# Patient Record
Sex: Male | Born: 1985 | Race: Black or African American | Hispanic: No | Marital: Single | Smoking: Former smoker
Health system: Southern US, Community
[De-identification: ages and names within clinical notes are randomized; demographics above are authoritative.]

## PROBLEM LIST (undated history)

## (undated) HISTORY — PX: ABDOMINAL SURGERY: SHX537

## (undated) HISTORY — PX: FOOT SURGERY: SHX648

---
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
## 2003-05-08 ENCOUNTER — Encounter: Payer: Self-pay | Admitting: Emergency Medicine

## 2003-05-08 ENCOUNTER — Inpatient Hospital Stay (HOSPITAL_COMMUNITY): Admission: AC | Admit: 2003-05-08 | Discharge: 2003-05-11 | Payer: Self-pay

## 2006-01-02 ENCOUNTER — Emergency Department (HOSPITAL_COMMUNITY): Admission: EM | Admit: 2006-01-02 | Discharge: 2006-01-02 | Payer: Self-pay | Admitting: Family Medicine

## 2008-04-28 ENCOUNTER — Emergency Department (HOSPITAL_COMMUNITY): Admission: EM | Admit: 2008-04-28 | Discharge: 2008-04-28 | Payer: Self-pay | Admitting: Emergency Medicine

## 2010-12-28 ENCOUNTER — Emergency Department (HOSPITAL_COMMUNITY)
Admission: EM | Admit: 2010-12-28 | Discharge: 2010-12-28 | Disposition: A | Discharge status: Home / Self Care | Payer: Self-pay | Attending: Emergency Medicine | Admitting: Emergency Medicine

## 2010-12-28 DIAGNOSIS — K589 Irritable bowel syndrome without diarrhea: Secondary | ICD-10-CM | POA: Insufficient documentation

## 2010-12-28 LAB — URINALYSIS, ROUTINE W REFLEX MICROSCOPIC
Bilirubin Urine: NEGATIVE
Hgb urine dipstick: NEGATIVE
Nitrite: NEGATIVE
Specific Gravity, Urine: 1.027 (ref 1.005–1.030)
Urobilinogen, UA: 0.2 mg/dL (ref 0.0–1.0)
pH: 6 (ref 5.0–8.0)
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
## 2011-02-05 ENCOUNTER — Inpatient Hospital Stay (INDEPENDENT_AMBULATORY_CARE_PROVIDER_SITE_OTHER)
Admission: RE | Admit: 2011-02-05 | Discharge: 2011-02-05 | Disposition: A | Discharge status: Home / Self Care | Payer: Self-pay | Source: Ambulatory Visit | Attending: Emergency Medicine | Admitting: Emergency Medicine

## 2011-02-05 DIAGNOSIS — M549 Dorsalgia, unspecified: Secondary | ICD-10-CM

## 2011-02-05 LAB — POCT URINALYSIS DIP (DEVICE)
Bilirubin Urine: NEGATIVE
Glucose, UA: NEGATIVE mg/dL
Ketones, ur: NEGATIVE mg/dL
Leukocytes, UA: NEGATIVE
Nitrite: NEGATIVE
pH: 7 (ref 5.0–8.0)

## 2011-02-23 ENCOUNTER — Inpatient Hospital Stay (INDEPENDENT_AMBULATORY_CARE_PROVIDER_SITE_OTHER)
Admission: RE | Admit: 2011-02-23 | Discharge: 2011-02-23 | Disposition: A | Discharge status: Home / Self Care | Payer: Self-pay | Source: Ambulatory Visit | Attending: Family Medicine | Admitting: Family Medicine

## 2011-02-23 DIAGNOSIS — L0291 Cutaneous abscess, unspecified: Secondary | ICD-10-CM

## 2011-05-30 LAB — URINALYSIS, ROUTINE W REFLEX MICROSCOPIC
Leukocytes, UA: NEGATIVE
Nitrite: NEGATIVE
Specific Gravity, Urine: 1.015
Urobilinogen, UA: 0.2
pH: 7

## 2011-05-30 LAB — URINE MICROSCOPIC-ADD ON

## 2012-07-24 ENCOUNTER — Emergency Department (HOSPITAL_COMMUNITY): Payer: Self-pay

## 2012-07-24 ENCOUNTER — Encounter (HOSPITAL_COMMUNITY): Payer: Self-pay | Admitting: *Deleted

## 2012-07-24 ENCOUNTER — Emergency Department (HOSPITAL_COMMUNITY)
Admission: EM | Admit: 2012-07-24 | Discharge: 2012-07-24 | Disposition: A | Discharge status: Home / Self Care | Payer: Self-pay | Attending: Emergency Medicine | Admitting: Emergency Medicine

## 2012-07-24 DIAGNOSIS — F17200 Nicotine dependence, unspecified, uncomplicated: Secondary | ICD-10-CM | POA: Insufficient documentation

## 2012-07-24 DIAGNOSIS — M79643 Pain in unspecified hand: Secondary | ICD-10-CM

## 2012-07-24 DIAGNOSIS — M25549 Pain in joints of unspecified hand: Secondary | ICD-10-CM | POA: Insufficient documentation

## 2012-07-24 MED ORDER — NAPROXEN 500 MG PO TABS: 500.0000 mg | ORAL_TABLET | Freq: Two times a day (BID) | ORAL | Status: DC

## 2012-07-24 NOTE — ED Notes (Signed)
Denies injury, swelling noted to left hand. Worse with movement

## 2012-07-24 NOTE — ED Provider Notes (Signed)
History  This chart was scribed for Benny Lennert, MD by Erskine Emery, ED Scribe. This patient was seen in room TR05C/TR05C and the patient's care was started at 17:50.   CSN: [id number]  Arrival date & time 07/24/12  1731   None     Chief Complaint  Patient presents with  . Hand Pain    (Consider location/radiation/quality/duration/timing/severity/associated sxs/prior Treatment) George [name] is a 26 y.o. male who presents to the Emergency Department complaining of left hand pain and swelling for the last several days. Pt denies any injury to the area. Pt reports he works for [employer] but he has not been working for the last week because he injured his right shoulder. Patient is a 26 y.o. male presenting with hand pain. The history is provided by the patient. No language interpreter was used.  Hand Pain This is a new problem. The current episode started more than 2 days ago. The problem occurs constantly. The problem has not changed since onset.Pertinent negatives include no chest pain and no shortness of breath. Nothing aggravates the symptoms. Nothing relieves the symptoms. He has tried acetaminophen (ibuprofen) for the symptoms. The treatment provided no relief.    History reviewed. No pertinent past medical history.  History reviewed. No pertinent past surgical history.  History reviewed. No pertinent family history.  History  Substance Use Topics  . Smoking status: Current Every Day Smoker  . Smokeless tobacco: Not on file  . Alcohol Use: Yes      Review of Systems  Constitutional: Negative for fever and chills.  Respiratory: Negative for shortness of breath.   Cardiovascular: Negative for chest pain.  Gastrointestinal: Negative for nausea and vomiting.  Musculoskeletal:       Left hand pain and swelling  Neurological: Negative for weakness.    Allergies  Review of patient's allergies indicates no known allergies.  Home Medications  No current  outpatient prescriptions on file.  BP 134/80  Pulse 84  Temp 98.6 F (37 C) (Oral)  Resp 20  SpO2 99%  Physical Exam  Nursing note and vitals reviewed. Constitutional: He is oriented to person, place, and time. He appears well-developed.  HENT:  Head: Normocephalic.  Eyes: Conjunctivae normal are normal.  Neck: No tracheal deviation present.  Cardiovascular:  No murmur heard. Musculoskeletal: Normal range of motion.       Left hand is mildly swollen and tender over the 3rd, 4th, and 5th metacarpals.  Neurological: He is oriented to person, place, and time.  Skin: Skin is warm.  Psychiatric: He has a normal mood and affect.    ED Course  Procedures (including critical care time) DIAGNOSTIC STUDIES: Oxygen Saturation is 99% on room air, normal by my interpretation.    COORDINATION OF CARE: 17:50--I evaluated the patient and we discussed a treatment plan including x-ray to which the pt agreed.   18:37--I rechecked the pt and explained to him that his x-ray shows no signs of a fracture. The pt reports he is going to get his shoulder rechecked on Monday and I told him to have that physician check on his hand then too. Pt reports he is taking 500mg  OTC Tylenol x2/day.  Dg Hand Complete Left  07/24/2012  *RADIOLOGY REPORT*  Clinical Data: 26 year old male with left hand and second metacarpal pain.  LEFT HAND - COMPLETE 3+ VIEW  Comparison: None  Findings: No evidence of acute fracture, subluxation or dislocation identified.  No radio-opaque foreign bodies are present.  No focal  bony lesions are noted.  The joint spaces are unremarkable.  IMPRESSION: No evidence of acute bony abnormality.   Original Report Authenticated By: Harmon Pier, M.D.      No diagnosis found.    MDM        The chart was scribed for me under my direct supervision.  I personally performed the history, physical, and medical decision making and all procedures in the evaluation of this  patient.Benny Lennert, MD 07/24/12 [phone]

## 2012-07-24 NOTE — ED Notes (Signed)
Patient with left hand pain and swelling.  Denies any injury to his arm.

## 2013-05-25 ENCOUNTER — Encounter (HOSPITAL_COMMUNITY): Payer: Self-pay | Admitting: Emergency Medicine

## 2013-05-25 ENCOUNTER — Emergency Department (INDEPENDENT_AMBULATORY_CARE_PROVIDER_SITE_OTHER)
Admission: EM | Admit: 2013-05-25 | Discharge: 2013-05-25 | Disposition: A | Discharge status: Home / Self Care | Payer: BC Managed Care – PPO | Source: Home / Self Care | Attending: Family Medicine | Admitting: Family Medicine

## 2013-05-25 DIAGNOSIS — S161XXA Strain of muscle, fascia and tendon at neck level, initial encounter: Secondary | ICD-10-CM

## 2013-05-25 DIAGNOSIS — S139XXA Sprain of joints and ligaments of unspecified parts of neck, initial encounter: Secondary | ICD-10-CM

## 2013-05-25 MED ORDER — TRAMADOL HCL 50 MG PO TABS: 50.0000 mg | ORAL_TABLET | Freq: Three times a day (TID) | ORAL | Status: DC | PRN

## 2013-05-25 MED ORDER — CYCLOBENZAPRINE HCL 10 MG PO TABS: 10.0000 mg | ORAL_TABLET | Freq: Two times a day (BID) | ORAL | Status: DC | PRN

## 2013-05-25 MED ORDER — IBUPROFEN 600 MG PO TABS: 600.0000 mg | ORAL_TABLET | Freq: Three times a day (TID) | ORAL | Status: DC

## 2013-05-25 NOTE — ED Provider Notes (Signed)
CSN: [id number]     Arrival date & time 05/25/13  0815 History   First MD Initiated Contact with Patient 05/25/13 [phone]     Chief Complaint  Patient presents with  . Neck Pain    since wednesday. denies injury   (Consider location/radiation/quality/duration/timing/severity/associated sxs/prior Treatment) HPI Comments: 27 year old male comes complaining of 4 days with neck pain with radiation toward bilateral shoulder blades and mid bilateral back. Patient denies any known injury or recent falls. Denies weakness numbness or paresthesias in his upper extremities. He states he had similar episodes in the past. First time he fell the pain wasn't waking up 4 days ago. Pain gets better some times during the day. Pain score with anterior flexion and bilateral lateralization of the head. Denies sore throat, fever or chills. Or any other symptoms. Reports he does not exercise and does not perform, lifting or strenuous activities.   History reviewed. No pertinent past medical history. History reviewed. No pertinent past surgical history. History reviewed. No pertinent family history. History  Substance Use Topics  . Smoking status: Current Every Day Smoker  . Smokeless tobacco: Not on file  . Alcohol Use: Yes    Review of Systems  Constitutional: Negative for fever, chills and fatigue.  HENT: Positive for neck pain and neck stiffness. Negative for congestion, sore throat, trouble swallowing and sinus pressure.   Eyes: Negative for visual disturbance.  Respiratory: Negative for cough, shortness of breath and wheezing.   Cardiovascular: Negative for chest pain.  Gastrointestinal: Negative for nausea, vomiting and abdominal pain.  Musculoskeletal: Positive for back pain.  Skin: Negative for rash.  Neurological: Negative for dizziness and headaches.    Allergies  Review of patient's allergies indicates no known allergies.  Home Medications   Current Outpatient Rx  Name  Route  Sig  Dispense   Refill  . cyclobenzaprine (FLEXERIL) 10 MG tablet   Oral   Take 1 tablet (10 mg total) by mouth 2 (two) times daily as needed for muscle spasms.   20 tablet   0   . ibuprofen (ADVIL,MOTRIN) 600 MG tablet   Oral   Take 1 tablet (600 mg total) by mouth 3 (three) times daily. Take with food   30 tablet   0   . traMADol (ULTRAM) 50 MG tablet   Oral   Take 1 tablet (50 mg total) by mouth every 8 (eight) hours as needed for pain.   15 tablet   0    BP 118/75  Pulse 58  Temp(Src) 97.8 F (36.6 C) (Oral)  Resp 14  SpO2 100% Physical Exam  Nursing note and vitals reviewed. Constitutional: He is oriented to person, place, and time. He appears well-developed and well-nourished. No distress.  HENT:  Head: Normocephalic and atraumatic.  Right Ear: External ear normal.  Left Ear: External ear normal.  Mouth/Throat: Oropharynx is clear and moist. No oropharyngeal exudate.  Eyes: Conjunctivae and EOM are normal. Pupils are equal, round, and reactive to light. No scleral icterus.  Neck: No JVD present. No thyromegaly present.  Cervical spine central: No obvious deformity. No pain over bone processes.  Fair range of motion despite reported pain. Able but painful to touch chest with chin, head extend with minimal discomfort. Also pain worse with bilateral head rotation. Increased tone and tenderness to palpation over sternocleidomastoid muscles bilaterally and mid scapula area bilateral. Negative Spurling test.   Lymphadenopathy:    He has no cervical adenopathy.  Neurological: He is alert and oriented to  person, place, and time. He has normal strength and normal reflexes. No cranial nerve deficit or sensory deficit.  Skin: No rash noted. He is not diaphoretic.    ED Course  Procedures (including critical care time) Labs Review Labs Reviewed - No data to display Imaging Review No results found.  MDM   1. Neck strain, initial encounter   Treated with Flexeril, ibuprofen and  tramadol. Supportive care including rehabilitation exercises and red flags that should prompt his return to medical attention discussed with patient and provided in writing.  Sharin Grave, MD 05/25/13 [phone]

## 2013-05-25 NOTE — ED Notes (Signed)
C/o neck pain/stiffness. Pain is felt at back of neck through shoulder blades mid way down spine. Denies injury. Pt has used otc meds and heating pad with no relief.

## 2013-05-29 ENCOUNTER — Other Ambulatory Visit: Payer: Self-pay | Admitting: Internal Medicine

## 2013-05-29 ENCOUNTER — Ambulatory Visit
Admission: RE | Admit: 2013-05-29 | Discharge: 2013-05-29 | Disposition: A | Discharge status: Home / Self Care | Payer: BC Managed Care – PPO | Source: Ambulatory Visit | Attending: Internal Medicine | Admitting: Internal Medicine

## 2013-05-29 DIAGNOSIS — M542 Cervicalgia: Secondary | ICD-10-CM

## 2013-05-29 DIAGNOSIS — E049 Nontoxic goiter, unspecified: Secondary | ICD-10-CM

## 2013-07-04 ENCOUNTER — Emergency Department (HOSPITAL_COMMUNITY)
Admission: EM | Admit: 2013-07-04 | Discharge: 2013-07-04 | Disposition: A | Discharge status: Home / Self Care | Payer: BC Managed Care – PPO | Attending: Emergency Medicine | Admitting: Emergency Medicine

## 2013-07-04 ENCOUNTER — Encounter (HOSPITAL_COMMUNITY): Payer: Self-pay | Admitting: Emergency Medicine

## 2013-07-04 DIAGNOSIS — R509 Fever, unspecified: Secondary | ICD-10-CM | POA: Insufficient documentation

## 2013-07-04 DIAGNOSIS — R1110 Vomiting, unspecified: Secondary | ICD-10-CM | POA: Insufficient documentation

## 2013-07-04 DIAGNOSIS — F17200 Nicotine dependence, unspecified, uncomplicated: Secondary | ICD-10-CM | POA: Insufficient documentation

## 2013-07-04 DIAGNOSIS — R6889 Other general symptoms and signs: Secondary | ICD-10-CM

## 2013-07-04 MED ORDER — ACETAMINOPHEN 325 MG PO TABS: 650.0000 mg | ORAL_TABLET | Freq: Four times a day (QID) | ORAL | Status: DC | PRN

## 2013-07-04 MED ORDER — IBUPROFEN 800 MG PO TABS: 800.0000 mg | ORAL_TABLET | Freq: Four times a day (QID) | ORAL | Status: DC | PRN

## 2013-07-04 MED ORDER — ONDANSETRON 4 MG PO TBDP: 4.0000 mg | ORAL_TABLET | Freq: Three times a day (TID) | ORAL | Status: DC | PRN

## 2013-07-04 MED ORDER — ACETAMINOPHEN 325 MG PO TABS
650.0000 mg | ORAL_TABLET | Freq: Once | ORAL | Status: AC
  Administered 2013-07-04: 650 mg via ORAL
  Filled 2013-07-04: qty 2

## 2013-07-04 NOTE — ED Notes (Signed)
Took aleve for pain last evening ~ 1700.  And, it help.  However, it did not help ~ 0300 am.

## 2013-07-04 NOTE — ED Notes (Signed)
The pt is c/o a headache for 2 days with chills fever and vomiting

## 2013-07-04 NOTE — ED Provider Notes (Signed)
CSN: [id number]     Arrival date & time 07/04/13  0606 History   First MD Initiated Contact with Patient 07/04/13 [phone]     Chief Complaint  Patient presents with  . Headache   (Consider location/radiation/quality/duration/timing/severity/associated sxs/prior Treatment) HPI Comments: Patient is a 27 yo M PMHx significant for HTN presenting to the ED for two days of subjective fevers and chills w/ associated mild generalized headache that he rates 1/10. Patient states he had one episode of non-bloody non-bilious emesis this morning while brushing his teeth. Patient states he has tried Advil once with relief of his symptoms, but has not tried any further anti-pyretics or pain relievers since then. He denies any aggravating factors. Patient does endorse that his niece is sick at home with the same symptoms. He denies any neck pain or stiffness, visual disturbance, rash, syncope, cough, rhinorrhea, ear pain, sore throat, abdominal pain. He denies any tick bites.    Patient is a 27 y.o. male presenting with headaches.  Headache Associated symptoms: vomiting (1 episode)   Associated symptoms: no abdominal pain, no back pain, no congestion, no cough, no dizziness, no ear pain, no nausea, no neck pain, no neck stiffness, no numbness, no sinus pressure and no sore throat     History reviewed. No pertinent past medical history. History reviewed. No pertinent past surgical history. No family history on file. History  Substance Use Topics  . Smoking status: Current Every Day Smoker  . Smokeless tobacco: Not on file  . Alcohol Use: Yes    Review of Systems  Constitutional: Positive for chills.  HENT: Negative for congestion, dental problem, ear pain, rhinorrhea, sinus pressure, sneezing, sore throat and trouble swallowing.   Eyes: Negative for visual disturbance.  Respiratory: Negative for cough, chest tightness and shortness of breath.   Cardiovascular: Negative for chest pain.  Gastrointestinal:  Positive for vomiting (1 episode). Negative for nausea and abdominal pain.  Genitourinary: Negative.   Musculoskeletal: Negative for back pain, neck pain and neck stiffness.  Skin: Negative for rash.  Neurological: Positive for headaches. Negative for dizziness, syncope, weakness, light-headedness and numbness.  All other systems reviewed and are negative.    Allergies  Review of patient's allergies indicates no known allergies.  Home Medications   Current Outpatient Rx  Name  Route  Sig  Dispense  Refill  . acetaminophen (TYLENOL) 325 MG tablet   Oral   Take 2 tablets (650 mg total) by mouth every 6 (six) hours as needed for fever.   30 tablet   0   . ibuprofen (ADVIL,MOTRIN) 800 MG tablet   Oral   Take 1 tablet (800 mg total) by mouth every 6 (six) hours as needed for fever.   21 tablet   0   . ondansetron (ZOFRAN ODT) 4 MG disintegrating tablet   Oral   Take 1 tablet (4 mg total) by mouth every 8 (eight) hours as needed for nausea or vomiting.   10 tablet   0    BP 101/63  Pulse 77  Temp(Src) 98.5 F (36.9 C) (Oral)  Resp 18  SpO2 95% Physical Exam  Constitutional: He is oriented to person, place, and time. He appears well-developed and well-nourished. No distress.  HENT:  Head: Normocephalic and atraumatic.  Right Ear: External ear normal.  Left Ear: External ear normal.  Nose: Nose normal.  Mouth/Throat: Oropharynx is clear and moist.  Eyes: Conjunctivae and EOM are normal. Pupils are equal, round, and reactive to light.  Neck:  Normal range of motion and full passive range of motion without pain. Neck supple. No spinous process tenderness and no muscular tenderness present. No rigidity. No edema and no erythema present. No Brudzinski's sign and no Kernig's sign noted.  Cardiovascular: Normal rate, regular rhythm, normal heart sounds and intact distal pulses.   Pulmonary/Chest: Effort normal and breath sounds normal. No respiratory distress. He has no wheezes.  He has no rales. He exhibits no tenderness.  Abdominal: Soft. Bowel sounds are normal. He exhibits no distension. There is no tenderness. There is no rebound and no guarding.  Musculoskeletal: Normal range of motion. He exhibits no edema.  Lymphadenopathy:    He has no cervical adenopathy.  Neurological: He is alert and oriented to person, place, and time. He has normal strength. No cranial nerve deficit or sensory deficit. Gait normal. GCS eye subscore is 4. GCS verbal subscore is 5. GCS motor subscore is 6.  No pronator drift. Bilateral heel-knee-shin intact.   Skin: Skin is warm and dry. No rash noted. He is not diaphoretic. No erythema.    ED Course  Procedures (including critical care time)  Medications  acetaminophen (TYLENOL) tablet 650 mg (650 mg Oral Given 07/04/13 0717)    Labs Review Labs Reviewed - No data to display Imaging Review No results found.  EKG Interpretation   None      Filed Vitals:   07/04/13 0756  BP: 101/63  Pulse: 77  Temp:   Resp: 18    MDM   1. Flu-like symptoms    Pt NAD, non-toxic appearing, AAOx4. No neurofocal deficits on examination. Headache non concerning for [facility], ICH, Meningitis, or temporal arteritis. No nuchal rigidity, or change in vision. Patient with symptoms consistent with influenza.  Vitals are stable, low-grade fever.  No signs of dehydration, tolerating PO's.  Lungs are clear. Due to patient's presentation and physical exam a chest x-ray was not ordered bc likely diagnosis of flu.  Discussed the cost versus benefit of Tamiflu treatment with the patient.  The patient understands that symptoms are greater than the recommended 24-48 hour window of treatment.  Patient will be discharged with instructions to orally hydrate, rest, and use over-the-counter medications such as anti-inflammatories ibuprofen and Aleve for muscle aches and Tylenol for fever.  Return precautions discussed. Patient is agreeable to plan. Patient is stable at  time of discharge. Patient d/w with Dr. Hyacinth Meeker, agrees with plan.          Jeannetta Ellis, PA-C 07/04/13 0827

## 2013-07-04 NOTE — ED Notes (Signed)
H/a, could not sleep last night, chills, warm/cold, and sweats. No coughing,

## 2013-07-05 NOTE — ED Provider Notes (Signed)
Medical screening examination/treatment/procedure(s) were performed by non-physician practitioner and as supervising physician I was immediately available for consultation/collaboration.    Vida Roller, MD 07/05/13 [phone]

## 2014-08-25 ENCOUNTER — Encounter (HOSPITAL_COMMUNITY): Payer: Self-pay | Admitting: Emergency Medicine

## 2014-08-25 ENCOUNTER — Emergency Department (HOSPITAL_COMMUNITY)
Admission: EM | Admit: 2014-08-25 | Discharge: 2014-08-25 | Disposition: A | Discharge status: Home / Self Care | Payer: BC Managed Care – PPO | Attending: Emergency Medicine | Admitting: Emergency Medicine

## 2014-08-25 ENCOUNTER — Emergency Department (HOSPITAL_COMMUNITY): Payer: BC Managed Care – PPO

## 2014-08-25 DIAGNOSIS — Z87891 Personal history of nicotine dependence: Secondary | ICD-10-CM | POA: Insufficient documentation

## 2014-08-25 DIAGNOSIS — R079 Chest pain, unspecified: Secondary | ICD-10-CM | POA: Insufficient documentation

## 2014-08-25 LAB — CBC WITH DIFFERENTIAL/PLATELET
Basophils Absolute: 0 10*3/uL (ref 0.0–0.1)
Basophils Relative: 0 % (ref 0–1)
Eosinophils Absolute: 0.2 10*3/uL (ref 0.0–0.7)
Eosinophils Relative: 4 % (ref 0–5)
HCT: 44 % (ref 39.0–52.0)
HEMOGLOBIN: 15 g/dL (ref 13.0–17.0)
LYMPHS ABS: 1.9 10*3/uL (ref 0.7–4.0)
Lymphocytes Relative: 34 % (ref 12–46)
MCH: 30.7 pg (ref 26.0–34.0)
MCHC: 34.1 g/dL (ref 30.0–36.0)
MCV: 90.2 fL (ref 78.0–100.0)
MONOS PCT: 7 % (ref 3–12)
Monocytes Absolute: 0.4 10*3/uL (ref 0.1–1.0)
NEUTROS ABS: 3 10*3/uL (ref 1.7–7.7)
NEUTROS PCT: 55 % (ref 43–77)
Platelets: 251 10*3/uL (ref 150–400)
RBC: 4.88 MIL/uL (ref 4.22–5.81)
RDW: 12.3 % (ref 11.5–15.5)
WBC: 5.5 10*3/uL (ref 4.0–10.5)

## 2014-08-25 LAB — COMPREHENSIVE METABOLIC PANEL
ALBUMIN: 4.1 g/dL (ref 3.5–5.2)
ALK PHOS: 52 U/L (ref 39–117)
ALT: 12 U/L (ref 0–53)
ANION GAP: 4 — AB (ref 5–15)
AST: 21 U/L (ref 0–37)
BILIRUBIN TOTAL: 0.6 mg/dL (ref 0.3–1.2)
BUN: 9 mg/dL (ref 6–23)
CHLORIDE: 103 meq/L (ref 96–112)
CO2: 32 mmol/L (ref 19–32)
Calcium: 9.2 mg/dL (ref 8.4–10.5)
Creatinine, Ser: 0.95 mg/dL (ref 0.50–1.35)
GFR calc Af Amer: >90 mL/min (ref >90)
GFR calc non Af Amer: >90 mL/min (ref >90)
GLUCOSE: 87 mg/dL (ref 70–99)
POTASSIUM: 4 mmol/L (ref 3.5–5.1)
SODIUM: 139 mmol/L (ref 135–145)
Total Protein: 6.3 g/dL (ref 6.0–8.3)

## 2014-08-25 LAB — LIPASE, BLOOD: Lipase: 26 U/L (ref 11–59)

## 2014-08-25 MED ORDER — RANITIDINE HCL 150 MG PO CAPS: 150.0000 mg | ORAL_CAPSULE | Freq: Every day | ORAL | Status: DC

## 2014-08-25 NOTE — ED Provider Notes (Signed)
CSN: [id number]     Arrival date & time 08/25/14  [id number] History   First MD Initiated Contact with Patient 08/25/14 [phone]     Chief Complaint  Patient presents with  . Abdominal Pain     (Consider location/radiation/quality/duration/timing/severity/associated sxs/prior Treatment) HPI Comments: Pt c/o intermittent sharp chest pain for the last year.pain is always over the left lower ribs. States that he was taking reflux medication and it was working but it is not longer working. Pain started this morning but has resolved. Doesn't feel sob with the symptoms. No n/v, fever, cough.  The history is provided by the patient. No language interpreter was used.    History reviewed. No pertinent past medical history. Past Surgical History  Procedure Laterality Date  . Abdominal surgery    . Foot surgery     No family history on file. History  Substance Use Topics  . Smoking status: Former Games developermoker  . Smokeless tobacco: Not on file  . Alcohol Use: Yes    Review of Systems  All other systems reviewed and are negative.     Allergies  Review of patient's allergies indicates no known allergies.  Home Medications   Prior to Admission medications   Medication Sig Start Date End Date Taking? Authorizing Provider  acetaminophen (TYLENOL) 325 MG tablet Take 2 tablets (650 mg total) by mouth every 6 (six) hours as needed for fever. 07/04/13  Yes Jennifer L Piepenbrink, PA-C  ibuprofen (ADVIL,MOTRIN) 800 MG tablet Take 1 tablet (800 mg total) by mouth every 6 (six) hours as needed for fever. 07/04/13  Yes Jennifer L Piepenbrink, PA-C  ondansetron (ZOFRAN ODT) 4 MG disintegrating tablet Take 1 tablet (4 mg total) by mouth every 8 (eight) hours as needed for nausea or vomiting. 07/04/13  Yes Jennifer L Piepenbrink, PA-C   BP 115/73 mmHg  Pulse 73  Temp(Src) 98.8 F (37.1 C) (Oral)  Resp 17  SpO2 100% Physical Exam  Constitutional: He is oriented to person, place, and time. He appears  well-developed and well-nourished.  HENT:  Head: Normocephalic and atraumatic.  Cardiovascular: Normal rate and regular rhythm.   Pulmonary/Chest: Effort normal and breath sounds normal. He exhibits no tenderness.  Abdominal: Soft. Bowel sounds are normal.  Musculoskeletal: Normal range of motion.  Neurological: He is alert and oriented to person, place, and time. Coordination normal.  Skin: Skin is warm and dry.  Nursing note and vitals reviewed.   ED Course  Procedures (including critical care time) Labs Review Labs Reviewed  COMPREHENSIVE METABOLIC PANEL - Abnormal; Notable for the following:    Anion gap 4 (*)    All other components within normal limits  CBC WITH DIFFERENTIAL  LIPASE, BLOOD    Imaging Review Dg Chest 2 View  08/25/2014   CLINICAL DATA:  Chest pain.  EXAM: CHEST  2 VIEW  COMPARISON:  None.  FINDINGS: Cardiopericardial silhouette within normal limits. Mediastinal contours normal. Trachea midline. No airspace disease or effusion.  IMPRESSION: No active cardiopulmonary disease.   Electronically Signed   By: Andreas NewportGeoffrey  Lamke M.D.   On: 08/25/2014 07:38     EKG Interpretation   Date/Time:  Wednesday August 25 2014 07:08:50 EST Ventricular Rate:  68 PR Interval:  143 QRS Duration: 90 QT Interval:  376 QTC Calculation: 400 R Axis:   64 Text Interpretation:  Sinus rhythm No significant change since last  tracing Confirmed by Ethelda ChickJACUBOWITZ  MD, SAM [phone] on 08/25/2014 7:11:35 AM      MDM  Final diagnoses:  Chest pain   Doubt cardiac in nature likely gi related.Will send home with zantac. Discussed follow up and return precautions    Teressa LowerVrinda Yaniris Braddock, NP 08/25/14 1531  Doug SouSam Jacubowitz, MD 08/28/14 1557

## 2014-08-25 NOTE — ED Notes (Signed)
Pt arrives with c/o intermittent sharp abdominal pain, states its been going on for about a year but was worse this morning. Was taking gas medication for pain but wasn't working.

## 2014-08-25 NOTE — Discharge Instructions (Signed)

## 2014-09-03 ENCOUNTER — Emergency Department (HOSPITAL_COMMUNITY)
Admission: EM | Admit: 2014-09-03 | Discharge: 2014-09-03 | Disposition: A | Discharge status: Home / Self Care | Payer: BC Managed Care – PPO | Attending: Emergency Medicine | Admitting: Emergency Medicine

## 2014-09-03 ENCOUNTER — Emergency Department (HOSPITAL_COMMUNITY): Admission: EM | Admit: 2014-09-03 | Discharge: 2014-09-03 | Payer: Self-pay | Source: Home / Self Care

## 2014-09-03 ENCOUNTER — Emergency Department (HOSPITAL_COMMUNITY): Payer: BC Managed Care – PPO

## 2014-09-03 ENCOUNTER — Encounter (HOSPITAL_COMMUNITY): Payer: Self-pay | Admitting: *Deleted

## 2014-09-03 DIAGNOSIS — J069 Acute upper respiratory infection, unspecified: Secondary | ICD-10-CM | POA: Insufficient documentation

## 2014-09-03 DIAGNOSIS — R0602 Shortness of breath: Secondary | ICD-10-CM

## 2014-09-03 DIAGNOSIS — Z79899 Other long term (current) drug therapy: Secondary | ICD-10-CM | POA: Insufficient documentation

## 2014-09-03 DIAGNOSIS — Z87891 Personal history of nicotine dependence: Secondary | ICD-10-CM | POA: Insufficient documentation

## 2014-09-03 NOTE — ED Provider Notes (Signed)
CSN: [id number]     Arrival date & time 09/03/14  0847 History   First MD Initiated Contact with Patient 09/03/14 [phone]     Chief Complaint  Patient presents with  . URI     (Consider location/radiation/quality/duration/timing/severity/associated sxs/prior Treatment) Patient is a 29 y.o. male presenting with URI. The history is provided by the patient and medical records.  URI Presenting symptoms: congestion and cough    This is a 29 y.o. M with no significant PMH presenting to the ED for URI type symptoms.  Specifically patient has had productive cough with yellow/green sputum, sneezing, and nasal congestion.  He has had numerous sick contacts at his work.  Endorses subjective fever and chills, seems worse at night time.  He denies chest pain or SOB.  Has started taking OTC claritin D which seems to be helping somewhat.  History reviewed. No pertinent past medical history. Past Surgical History  Procedure Laterality Date  . Abdominal surgery    . Foot surgery     No family history on file. History  Substance Use Topics  . Smoking status: Former Games developer  . Smokeless tobacco: Not on file  . Alcohol Use: Yes    Review of Systems  HENT: Positive for congestion.   Respiratory: Positive for cough.   All other systems reviewed and are negative.     Allergies  Review of patient's allergies indicates no known allergies.  Home Medications   Prior to Admission medications   Medication Sig Start Date End Date Taking? Authorizing Provider  acetaminophen (TYLENOL) 325 MG tablet Take 2 tablets (650 mg total) by mouth every 6 (six) hours as needed for fever. 07/04/13   Jennifer L Piepenbrink, PA-C  ibuprofen (ADVIL,MOTRIN) 800 MG tablet Take 1 tablet (800 mg total) by mouth every 6 (six) hours as needed for fever. 07/04/13   Jennifer L Piepenbrink, PA-C  ondansetron (ZOFRAN ODT) 4 MG disintegrating tablet Take 1 tablet (4 mg total) by mouth every 8 (eight) hours as needed for nausea or  vomiting. 07/04/13   Jennifer L Piepenbrink, PA-C  ranitidine (ZANTAC) 150 MG capsule Take 1 capsule (150 mg total) by mouth daily. 08/25/14   Teressa Lower, NP   BP 116/74 mmHg  Pulse 74  Temp(Src) 98.3 F (36.8 C) (Oral)  Resp 24  Ht  (1.753 m)  Wt 140 lb (63.504 kg)  BMI 20.67 kg/m2  SpO2 99%   Physical Exam  Constitutional: He is oriented to person, place, and time. He appears well-developed and well-nourished.  HENT:  Head: Normocephalic and atraumatic.  Right Ear: Tympanic membrane and ear canal normal.  Left Ear: Tympanic membrane and ear canal normal.  Nose: Mucosal edema and rhinorrhea (clear) present.  Mouth/Throat: Uvula is midline, oropharynx is clear and moist and mucous membranes are normal. No oropharyngeal exudate, posterior oropharyngeal edema, posterior oropharyngeal erythema or tonsillar abscesses.  PND noted; Tonsils normal in appearance bilaterally without exudate; uvula midline without peritonsillar abscess; handling secretions appropriately; no difficulty swallowing or speaking  Eyes: Conjunctivae and EOM are normal. Pupils are equal, round, and reactive to light.  Neck: Normal range of motion.  Cardiovascular: Normal rate, regular rhythm and normal heart sounds.   Pulmonary/Chest: Effort normal and breath sounds normal. No respiratory distress. He has no wheezes.  Lungs clear bilaterally, productive cough with white sputum noted  Abdominal: Soft. Bowel sounds are normal.  Musculoskeletal: Normal range of motion.  Neurological: He is alert and oriented to person, place, and time.  Skin:  Skin is warm and dry.  Psychiatric: He has a normal mood and affect.  Nursing note and vitals reviewed.   ED Course  Procedures (including critical care time) Labs Review Labs Reviewed - No data to display  Imaging Review No results found.   EKG Interpretation None      MDM   Final diagnoses:  URI (upper respiratory infection)   29 y.o. M with URI type  symptoms.  CXR negative.  No chest pain or SOB-- doubt ACS, PE, dissection, or other acute cardiac event at this time.  Patient d/c home with supportive care.  FU with PCP. Discussed plan with patient, he/she acknowledged understanding and agreed with plan of care.  Return precautions given for new or worsening symptoms.  Garlon HatchetLisa M Taffany Heiser, PA-C 09/03/14 1226  Doug SouSam Jacubowitz, MD 09/03/14 [phone]

## 2014-09-03 NOTE — ED Notes (Signed)
Patient reports he has had cold sx since Sunday with coughing and sneezing.  He denies fevers.  Patient is having hot spells at night.  He also states she get light headed at times when he has coughing spells,  He states he has productive cough at times with green yellow sputum.  Patient has tried claritin D.  Patient resides at home.

## 2014-09-03 NOTE — Discharge Instructions (Signed)
Continue over the counter medications as needed. Follow-up with your primary care physician. Return to the ED for new concerns.

## 2014-10-09 ENCOUNTER — Emergency Department (HOSPITAL_COMMUNITY)
Admission: EM | Admit: 2014-10-09 | Discharge: 2014-10-09 | Disposition: A | Discharge status: Home / Self Care | Payer: BC Managed Care – PPO | Attending: Emergency Medicine | Admitting: Emergency Medicine

## 2014-10-09 ENCOUNTER — Encounter (HOSPITAL_COMMUNITY): Payer: Self-pay | Admitting: Emergency Medicine

## 2014-10-09 DIAGNOSIS — Z87891 Personal history of nicotine dependence: Secondary | ICD-10-CM | POA: Insufficient documentation

## 2014-10-09 DIAGNOSIS — Z9889 Other specified postprocedural states: Secondary | ICD-10-CM | POA: Insufficient documentation

## 2014-10-09 DIAGNOSIS — K2920 Alcoholic gastritis without bleeding: Secondary | ICD-10-CM

## 2014-10-09 DIAGNOSIS — Z79899 Other long term (current) drug therapy: Secondary | ICD-10-CM | POA: Insufficient documentation

## 2014-10-09 LAB — RAPID URINE DRUG SCREEN, HOSP PERFORMED
AMPHETAMINES: NOT DETECTED
BENZODIAZEPINES: NOT DETECTED
Barbiturates: NOT DETECTED
Cocaine: NOT DETECTED
OPIATES: NOT DETECTED
Tetrahydrocannabinol: NOT DETECTED

## 2014-10-09 LAB — CBC WITH DIFFERENTIAL/PLATELET
BASOS PCT: 0 % (ref 0–1)
Basophils Absolute: 0 10*3/uL (ref 0.0–0.1)
Eosinophils Absolute: 0.1 10*3/uL (ref 0.0–0.7)
Eosinophils Relative: 2 % (ref 0–5)
HCT: 46.2 % (ref 39.0–52.0)
Hemoglobin: 16 g/dL (ref 13.0–17.0)
LYMPHS ABS: 1.2 10*3/uL (ref 0.7–4.0)
Lymphocytes Relative: 18 % (ref 12–46)
MCH: 31.4 pg (ref 26.0–34.0)
MCHC: 34.6 g/dL (ref 30.0–36.0)
MCV: 90.6 fL (ref 78.0–100.0)
Monocytes Absolute: 0.3 10*3/uL (ref 0.1–1.0)
Monocytes Relative: 5 % (ref 3–12)
NEUTROS ABS: 5 10*3/uL (ref 1.7–7.7)
NEUTROS PCT: 75 % (ref 43–77)
Platelets: 250 10*3/uL (ref 150–400)
RBC: 5.1 MIL/uL (ref 4.22–5.81)
RDW: 12.6 % (ref 11.5–15.5)
WBC: 6.7 10*3/uL (ref 4.0–10.5)

## 2014-10-09 LAB — COMPREHENSIVE METABOLIC PANEL
ALK PHOS: 51 U/L (ref 39–117)
ALT: 19 U/L (ref 0–53)
AST: 28 U/L (ref 0–37)
Albumin: 4.4 g/dL (ref 3.5–5.2)
Anion gap: 7 (ref 5–15)
BUN: 7 mg/dL (ref 6–23)
CHLORIDE: 106 mmol/L (ref 96–112)
CO2: 28 mmol/L (ref 19–32)
Calcium: 9.2 mg/dL (ref 8.4–10.5)
Creatinine, Ser: 0.83 mg/dL (ref 0.50–1.35)
GFR calc non Af Amer: >90 mL/min (ref >90)
Glucose, Bld: 98 mg/dL (ref 70–99)
POTASSIUM: 3.7 mmol/L (ref 3.5–5.1)
SODIUM: 141 mmol/L (ref 135–145)
TOTAL PROTEIN: 7.1 g/dL (ref 6.0–8.3)
Total Bilirubin: 1.7 mg/dL — ABNORMAL HIGH (ref 0.3–1.2)

## 2014-10-09 LAB — LIPASE, BLOOD: LIPASE: 19 U/L (ref 11–59)

## 2014-10-09 LAB — ETHANOL: ALCOHOL ETHYL (B): 33 mg/dL — AB (ref 0–9)

## 2014-10-09 MED ORDER — SUCRALFATE 1 G PO TABS: 1.0000 g | ORAL_TABLET | Freq: Four times a day (QID) | ORAL | Status: DC

## 2014-10-09 MED ORDER — SODIUM CHLORIDE 0.9 % IV SOLN
INTRAVENOUS | Status: DC
  Administered 2014-10-09: 21:00:00 via INTRAVENOUS

## 2014-10-09 MED ORDER — ONDANSETRON HCL 4 MG/2ML IJ SOLN
4.0000 mg | Freq: Once | INTRAMUSCULAR | Status: AC
  Administered 2014-10-09: 4 mg via INTRAVENOUS
  Filled 2014-10-09: qty 2

## 2014-10-09 MED ORDER — PANTOPRAZOLE SODIUM 40 MG IV SOLR
40.0000 mg | Freq: Once | INTRAVENOUS | Status: AC
  Administered 2014-10-09: 40 mg via INTRAVENOUS
  Filled 2014-10-09: qty 40

## 2014-10-09 MED ORDER — ONDANSETRON 8 MG PO TBDP: 8.0000 mg | ORAL_TABLET | Freq: Three times a day (TID) | ORAL | Status: DC | PRN

## 2014-10-09 MED ORDER — SODIUM CHLORIDE 0.9 % IV BOLUS (SEPSIS)
2000.0000 mL | Freq: Once | INTRAVENOUS | Status: AC
Start: 2014-10-09 — End: 2014-10-09
  Administered 2014-10-09: 2000 mL via INTRAVENOUS

## 2014-10-09 NOTE — Discharge Instructions (Signed)
Gastritis, Adult Gastritis is soreness and swelling (inflammation) of the lining of the stomach. Gastritis can develop as a sudden onset (acute) or long-term (chronic) condition. If gastritis is not treated, it can lead to stomach bleeding and ulcers. CAUSES  Gastritis occurs when the stomach lining is weak or damaged. Digestive juices from the stomach then inflame the weakened stomach lining. The stomach lining may be weak or damaged due to viral or bacterial infections. One common bacterial infection is the Helicobacter pylori infection. Gastritis can also result from excessive alcohol consumption, taking certain medicines, or having too much acid in the stomach.  SYMPTOMS  In some cases, there are no symptoms. When symptoms are present, they may include:  Pain or a burning sensation in the upper abdomen.  Nausea.  Vomiting.  An uncomfortable feeling of fullness after eating. DIAGNOSIS  Your caregiver may suspect you have gastritis based on your symptoms and a physical exam. To determine the cause of your gastritis, your caregiver may perform the following:  Blood or stool tests to check for the H pylori bacterium.  Gastroscopy. A thin, flexible tube (endoscope) is passed down the esophagus and into the stomach. The endoscope has a light and camera on the end. Your caregiver uses the endoscope to view the inside of the stomach.  Taking a tissue sample (biopsy) from the stomach to examine under a microscope. TREATMENT  Depending on the cause of your gastritis, medicines may be prescribed. If you have a bacterial infection, such as an H pylori infection, antibiotics may be given. If your gastritis is caused by too much acid in the stomach, H2 blockers or antacids may be given. Your caregiver may recommend that you stop taking aspirin, ibuprofen, or other nonsteroidal anti-inflammatory drugs (NSAIDs). HOME CARE INSTRUCTIONS  Only take over-the-counter or prescription medicines as directed by  your caregiver.  If you were given antibiotic medicines, take them as directed. Finish them even if you start to feel better.  Drink enough fluids to keep your urine clear or pale yellow.  Avoid foods and drinks that make your symptoms worse, such as:  Caffeine or alcoholic drinks.  Chocolate.  Peppermint or mint flavorings.  Garlic and onions.  Spicy foods.  Citrus fruits, such as oranges, lemons, or limes.  Tomato-based foods such as sauce, chili, salsa, and pizza.  Fried and fatty foods.  Eat small, frequent meals instead of large meals. SEEK IMMEDIATE MEDICAL CARE IF:   You have black or dark red stools.  You vomit blood or material that looks like coffee grounds.  You are unable to keep fluids down.  Your abdominal pain gets worse.  You have a fever.  You do not feel better after 1 week.  You have any other questions or concerns. MAKE SURE YOU:  Understand these instructions.  Will watch your condition.  Will get help right away if you are not doing well or get worse. Document Released: 08/07/2001 Document Revised: 02/12/2012 Document Reviewed: 09/26/2011 [facility] Patient Information 2015 [place], [place]. This information is not intended to replace advice given to you by your health care provider. Make sure you discuss any questions you have with your health care provider.  Gastritis, Adult Gastritis is soreness and swelling (inflammation) of the lining of the stomach. Gastritis can develop as a sudden onset (acute) or long-term (chronic) condition. If gastritis is not treated, it can lead to stomach bleeding and ulcers. CAUSES  Gastritis occurs when the stomach lining is weak or damaged. Digestive juices from  the stomach then inflame the weakened stomach lining. The stomach lining may be weak or damaged due to viral or bacterial infections. One common bacterial infection is the Helicobacter pylori infection. Gastritis can also result from excessive alcohol  consumption, taking certain medicines, or having too much acid in the stomach.  SYMPTOMS  In some cases, there are no symptoms. When symptoms are present, they may include:  Pain or a burning sensation in the upper abdomen.  Nausea.  Vomiting.  An uncomfortable feeling of fullness after eating. DIAGNOSIS  Your caregiver may suspect you have gastritis based on your symptoms and a physical exam. To determine the cause of your gastritis, your caregiver may perform the following:  Blood or stool tests to check for the H pylori bacterium.  Gastroscopy. A thin, flexible tube (endoscope) is passed down the esophagus and into the stomach. The endoscope has a light and camera on the end. Your caregiver uses the endoscope to view the inside of the stomach.  Taking a tissue sample (biopsy) from the stomach to examine under a microscope. TREATMENT  Depending on the cause of your gastritis, medicines may be prescribed. If you have a bacterial infection, such as an H pylori infection, antibiotics may be given. If your gastritis is caused by too much acid in the stomach, H2 blockers or antacids may be given. Your caregiver may recommend that you stop taking aspirin, ibuprofen, or other nonsteroidal anti-inflammatory drugs (NSAIDs). HOME CARE INSTRUCTIONS  Only take over-the-counter or prescription medicines as directed by your caregiver.  If you were given antibiotic medicines, take them as directed. Finish them even if you start to feel better.  Drink enough fluids to keep your urine clear or pale yellow.  Avoid foods and drinks that make your symptoms worse, such as:  Caffeine or alcoholic drinks.  Chocolate.  Peppermint or mint flavorings.  Garlic and onions.  Spicy foods.  Citrus fruits, such as oranges, lemons, or limes.  Tomato-based foods such as sauce, chili, salsa, and pizza.  Fried and fatty foods.  Eat small, frequent meals instead of large meals. SEEK IMMEDIATE MEDICAL  CARE IF:   You have black or dark red stools.  You vomit blood or material that looks like coffee grounds.  You are unable to keep fluids down.  Your abdominal pain gets worse.  You have a fever.  You do not feel better after 1 week.  You have any other questions or concerns. MAKE SURE YOU:  Understand these instructions.  Will watch your condition.  Will get help right away if you are not doing well or get worse. Document Released: 08/07/2001 Document Revised: 02/12/2012 Document Reviewed: 09/26/2011 [facility] Patient Information 2015 [place]. This information is not intended to replace advice given to you by your health care provider. Make sure you discuss any questions you have with your health care provider.

## 2014-10-09 NOTE — ED Notes (Signed)
Pt reports that he thinks he has alcohol poisoning from drinking liquor last night.  Pt reports last drink was at 3am and does not recall how much he drank.  Pt is experiencing nausea vomiting and lightheadedness.  Pt alert and oriented.

## 2014-10-09 NOTE — ED Notes (Signed)
Pt reports ,"I think I have alcohol poisoning." Pt unable to tolerate PO fluids or food. Last drink was 0300...ciroc.

## 2014-10-09 NOTE — ED Provider Notes (Signed)
CSN: [id number]     Arrival date & time 10/09/14  1836 History   First MD Initiated Contact with Patient 10/09/14 2009     Chief Complaint  Patient presents with  . Alcohol Problem  . Emesis     (Consider location/radiation/quality/duration/timing/severity/associated sxs/prior Treatment) HPI Comments: Patient here after having emesis all day due to excessive alcohol use yesterday evening. Denies any blood in his emesis. He has been nonbilious. No black or bloody stools. No fever or chills. Denies any epigastric abdominal pain. Can't keep any fluids down. Symptoms persistent and nothing makes them better. No treatment used prior to arrival.  Patient is a 29 y.o. male presenting with alcohol problem and vomiting. The history is provided by the patient.  Alcohol Problem  Emesis   History reviewed. No pertinent past medical history. Past Surgical History  Procedure Laterality Date  . Abdominal surgery    . Foot surgery     No family history on file. History  Substance Use Topics  . Smoking status: Former Games developer  . Smokeless tobacco: Not on file  . Alcohol Use: Yes    Review of Systems  Gastrointestinal: Positive for vomiting.  All other systems reviewed and are negative.     Allergies  Review of patient's allergies indicates no known allergies.  Home Medications   Prior to Admission medications   Medication Sig Start Date End Date Taking? Authorizing Provider  acetaminophen (TYLENOL) 325 MG tablet Take 2 tablets (650 mg total) by mouth every 6 (six) hours as needed for fever. 07/04/13   Jennifer L Piepenbrink, PA-C  ibuprofen (ADVIL,MOTRIN) 800 MG tablet Take 1 tablet (800 mg total) by mouth every 6 (six) hours as needed for fever. 07/04/13   Jennifer L Piepenbrink, PA-C  ondansetron (ZOFRAN ODT) 4 MG disintegrating tablet Take 1 tablet (4 mg total) by mouth every 8 (eight) hours as needed for nausea or vomiting. 07/04/13   Jennifer L Piepenbrink, PA-C  ranitidine (ZANTAC)  150 MG capsule Take 1 capsule (150 mg total) by mouth daily. 08/25/14   Teressa Lower, NP   BP 109/74 mmHg  Pulse 74  Temp(Src) 97.8 F (36.6 C) (Oral)  Resp 14  Ht  (1.753 m)  Wt 140 lb (63.504 kg)  BMI 20.67 kg/m2  SpO2 99% Physical Exam  Constitutional: He is oriented to person, place, and time. He appears well-developed and well-nourished.  Non-toxic appearance. No distress.  HENT:  Head: Normocephalic and atraumatic.  Eyes: Conjunctivae, EOM and lids are normal. Pupils are equal, round, and reactive to light.  Neck: Normal range of motion. Neck supple. No tracheal deviation present. No thyroid mass present.  Cardiovascular: Normal rate, regular rhythm and normal heart sounds.  Exam reveals no gallop.   No murmur heard. Pulmonary/Chest: Effort normal and breath sounds normal. No stridor. No respiratory distress. He has no decreased breath sounds. He has no wheezes. He has no rhonchi. He has no rales.  Abdominal: Soft. Normal appearance and bowel sounds are normal. He exhibits no distension. There is no tenderness. There is no rebound and no CVA tenderness.  Musculoskeletal: Normal range of motion. He exhibits no edema or tenderness.  Neurological: He is alert and oriented to person, place, and time. He has normal strength. No cranial nerve deficit or sensory deficit. GCS eye subscore is 4. GCS verbal subscore is 5. GCS motor subscore is 6.  Skin: Skin is warm and dry. No abrasion and no rash noted.  Psychiatric: He has a normal mood  and affect. His speech is normal and behavior is normal.  Nursing note and vitals reviewed.   ED Course  Procedures (including critical care time) Labs Review Labs Reviewed  COMPREHENSIVE METABOLIC PANEL - Abnormal; Notable for the following:    Total Bilirubin 1.7 (*)    All other components within normal limits  ETHANOL - Abnormal; Notable for the following:    Alcohol, Ethyl (B) 33 (*)    All other components within normal limits  CBC  WITH DIFFERENTIAL/PLATELET  LIPASE, BLOOD  URINE RAPID DRUG SCREEN (HOSP PERFORMED)    Imaging Review No results found.   EKG Interpretation None      MDM   Final diagnoses:  None    Patient given IV fluids and medications for suspected alcoholic gastritis. Rechecked and is feeling better at this time and is stable for discharge    Toy BakerAnthony T Din Bookwalter, MD 10/09/14 2223

## 2014-10-09 NOTE — ED Notes (Signed)
Pt made aware to return if symptoms worsen or if any life threatening symptoms occur.   

## 2014-12-30 ENCOUNTER — Emergency Department (HOSPITAL_COMMUNITY): Payer: BC Managed Care – PPO

## 2014-12-30 ENCOUNTER — Emergency Department (HOSPITAL_COMMUNITY)
Admission: EM | Admit: 2014-12-30 | Discharge: 2014-12-30 | Disposition: A | Discharge status: Home / Self Care | Payer: BC Managed Care – PPO | Attending: Emergency Medicine | Admitting: Emergency Medicine

## 2014-12-30 ENCOUNTER — Encounter (HOSPITAL_COMMUNITY): Payer: Self-pay | Admitting: Emergency Medicine

## 2014-12-30 DIAGNOSIS — Z87891 Personal history of nicotine dependence: Secondary | ICD-10-CM | POA: Insufficient documentation

## 2014-12-30 DIAGNOSIS — R109 Unspecified abdominal pain: Secondary | ICD-10-CM

## 2014-12-30 DIAGNOSIS — R1084 Generalized abdominal pain: Secondary | ICD-10-CM | POA: Insufficient documentation

## 2014-12-30 DIAGNOSIS — Z87442 Personal history of urinary calculi: Secondary | ICD-10-CM | POA: Insufficient documentation

## 2014-12-30 DIAGNOSIS — Z9889 Other specified postprocedural states: Secondary | ICD-10-CM | POA: Insufficient documentation

## 2014-12-30 DIAGNOSIS — Z79899 Other long term (current) drug therapy: Secondary | ICD-10-CM | POA: Insufficient documentation

## 2014-12-30 DIAGNOSIS — M549 Dorsalgia, unspecified: Secondary | ICD-10-CM | POA: Insufficient documentation

## 2014-12-30 LAB — COMPREHENSIVE METABOLIC PANEL
ALT: 19 U/L (ref 17–63)
AST: 24 U/L (ref 15–41)
Albumin: 4.1 g/dL (ref 3.5–5.0)
Alkaline Phosphatase: 46 U/L (ref 38–126)
Anion gap: 9 (ref 5–15)
BUN: 8 mg/dL (ref 6–20)
CHLORIDE: 105 mmol/L (ref 101–111)
CO2: 28 mmol/L (ref 22–32)
CREATININE: 0.98 mg/dL (ref 0.61–1.24)
Calcium: 9.1 mg/dL (ref 8.9–10.3)
GFR calc non Af Amer: >60 mL/min (ref >60)
GLUCOSE: 81 mg/dL (ref 70–99)
Potassium: 3.6 mmol/L (ref 3.5–5.1)
Sodium: 142 mmol/L (ref 135–145)
Total Bilirubin: 0.7 mg/dL (ref 0.3–1.2)
Total Protein: 6.4 g/dL — ABNORMAL LOW (ref 6.5–8.1)

## 2014-12-30 LAB — CBC WITH DIFFERENTIAL/PLATELET
Basophils Absolute: 0 10*3/uL (ref 0.0–0.1)
Basophils Relative: 0 % (ref 0–1)
Eosinophils Absolute: 0.3 10*3/uL (ref 0.0–0.7)
Eosinophils Relative: 4 % (ref 0–5)
HEMATOCRIT: 43.2 % (ref 39.0–52.0)
Hemoglobin: 14.8 g/dL (ref 13.0–17.0)
LYMPHS PCT: 31 % (ref 12–46)
Lymphs Abs: 2.3 10*3/uL (ref 0.7–4.0)
MCH: 31.4 pg (ref 26.0–34.0)
MCHC: 34.3 g/dL (ref 30.0–36.0)
MCV: 91.5 fL (ref 78.0–100.0)
MONO ABS: 0.5 10*3/uL (ref 0.1–1.0)
Monocytes Relative: 6 % (ref 3–12)
Neutro Abs: 4.2 10*3/uL (ref 1.7–7.7)
Neutrophils Relative %: 59 % (ref 43–77)
Platelets: 247 10*3/uL (ref 150–400)
RBC: 4.72 MIL/uL (ref 4.22–5.81)
RDW: 12.2 % (ref 11.5–15.5)
WBC: 7.2 10*3/uL (ref 4.0–10.5)

## 2014-12-30 MED ORDER — HYDROMORPHONE HCL 1 MG/ML IJ SOLN
1.0000 mg | Freq: Once | INTRAMUSCULAR | Status: AC
  Administered 2014-12-30: 1 mg via INTRAVENOUS
  Filled 2014-12-30: qty 1

## 2014-12-30 MED ORDER — IBUPROFEN 600 MG PO TABS: 600.0000 mg | ORAL_TABLET | Freq: Three times a day (TID) | ORAL | Status: AC | PRN

## 2014-12-30 MED ORDER — HYDROCODONE-ACETAMINOPHEN 5-325 MG PO TABS: 1.0000 | ORAL_TABLET | Freq: Four times a day (QID) | ORAL | Status: AC | PRN

## 2014-12-30 NOTE — ED Provider Notes (Signed)
CSN: [id number]     Arrival date & time 12/30/14  [id number] History   First MD Initiated Contact with Patient 12/30/14 [phone]     Chief Complaint  Patient presents with  . Abdominal Pain  . Back Pain      HPI Patient reports right flank pain since eating last night.  He tried gas pills without improvement in symptoms.  He denies nausea vomiting.  He reports the majority of the pain is located in his right flank and back region.  No urinary symptoms.  Patient does have a history of kidney stones.  Denies radiation of his pain around to his abdomen.  Ports possible abdominal pain earlier which now has resolved.  He states he had some sort of abdominal surgery when he was 29 years old but does not know the name of it.  He has a scar in his right abdomen.  Patient has not had symptoms like this before.  No productive cough.  No fevers or chills.  No urinary complaints.  Pain is worse with movement.   History reviewed. No pertinent past medical history. Past Surgical History  Procedure Laterality Date  . Abdominal surgery    . Foot surgery     History reviewed. No pertinent family history. History  Substance Use Topics  . Smoking status: Former Games developermoker  . Smokeless tobacco: Not on file  . Alcohol Use: No    Review of Systems  All other systems reviewed and are negative.     Allergies  Review of patient's allergies indicates no known allergies.  Home Medications   Prior to Admission medications   Medication Sig Start Date End Date Taking? Authorizing Provider  naproxen sodium (ANAPROX) 220 MG tablet Take 440 mg by mouth daily as needed (pain).   Yes Historical Provider, MD  acetaminophen (TYLENOL) 325 MG tablet Take 2 tablets (650 mg total) by mouth every 6 (six) hours as needed for fever. Patient not taking: Reported on 12/30/2014 07/04/13   Francee PiccoloJennifer Piepenbrink, PA-C  ibuprofen (ADVIL,MOTRIN) 800 MG tablet Take 1 tablet (800 mg total) by mouth every 6 (six) hours as needed for  fever. Patient not taking: Reported on 12/30/2014 07/04/13   Victorino DikeJennifer Piepenbrink, PA-C  ondansetron (ZOFRAN ODT) 8 MG disintegrating tablet Take 1 tablet (8 mg total) by mouth every 8 (eight) hours as needed for nausea or vomiting. Patient not taking: Reported on 12/30/2014 10/09/14   Lorre NickAnthony Allen, MD  ranitidine (ZANTAC) 150 MG capsule Take 1 capsule (150 mg total) by mouth daily. Patient not taking: Reported on 12/30/2014 08/25/14   Teressa LowerVrinda Pickering, NP  sucralfate (CARAFATE) 1 G tablet Take 1 tablet (1 g total) by mouth 4 (four) times daily. Patient not taking: Reported on 12/30/2014 10/09/14   Lorre NickAnthony Allen, MD   BP 107/68 mmHg  Pulse 69  Resp 18  SpO2 97% Physical Exam  Constitutional: He is oriented to person, place, and time. He appears well-developed and well-nourished.  HENT:  Head: Normocephalic and atraumatic.  Eyes: EOM are normal.  Neck: Normal range of motion.  Cardiovascular: Normal rate, regular rhythm, normal heart sounds and intact distal pulses.   Pulmonary/Chest: Effort normal and breath sounds normal. No respiratory distress.  Abdominal: Soft. He exhibits no distension.  Mild generalized right-sided abdominal tenderness.  This is mild  Musculoskeletal: Normal range of motion.  Mild right flank tenderness.  No rash.  Neurological: He is alert and oriented to person, place, and time.  Skin: Skin is warm and dry.  Psychiatric: He has a normal mood and affect. Judgment normal.  Nursing note and vitals reviewed.   ED Course  Procedures (including critical care time) Labs Review Labs Reviewed  COMPREHENSIVE METABOLIC PANEL - Abnormal; Notable for the following:    Total Protein 6.4 (*)    All other components within normal limits  CBC WITH DIFFERENTIAL/PLATELET  URINALYSIS, ROUTINE W REFLEX MICROSCOPIC    Imaging Review Ct Abdomen Pelvis Wo Contrast  12/30/2014   CLINICAL DATA:  29 year old male with right side abdomen and rib pain after eating last night. Initial  encounter.  EXAM: CT ABDOMEN AND PELVIS WITHOUT CONTRAST  TECHNIQUE: Multidetector CT imaging of the abdomen and pelvis was performed following the standard protocol without IV contrast.  COMPARISON:  CT Abdomen and Pelvis 04/28/2008.  FINDINGS: Negative lung bases.  No pericardial or pleural effusion.  No osseous abnormality identified. Transitional lumbosacral anatomy.  There is a small volume of free fluid along the central and right hemipelvis (series 201, image 65). The rectum is unremarkable. The sigmoid colon is decompressed. The bladder is diminutive.  Negative left colon. Negative transverse colon aside from retained stool. Negative right colon aside from retained stool. There is a trace amount of free fluid in the caudal aspect of the right gutter near the cecum best seen on coronal image 38. However, the appendix arises medially from the cecum and appears normal (series 21, image 60 and coronal image 55). It courses into the right hemipelvis and terminates near the small volume of free fluid. It contains gas throughout much of its course.  The terminal ileum also appears within normal limits. No dilated small bowel. No inflamed small bowel loops are evident. Negative non contrast stomach and duodenum.  Negative non contrast liver, gallbladder, spleen, pancreas and adrenal glands. No abdominal free fluid.  Negative non contrast left kidney and ureter.  Mildly dense medullary pyramids bilaterally, but no discrete renal stone. No right hydronephrosis or perinephric stranding. No right hydroureter or periureteral stranding. No calculus along the course of the right ureter. There are several new right hemipelvis phleboliths. There is also a small dystrophic calculus along the left iliopsoas muscle which is new (image 52).  No lymphadenopathy identified.  IMPRESSION: 1. Abnormal but nonspecific small volume of free fluid in the right lower quadrant and pelvis. This fluid abuts both the cecum and appendix which  otherwise appear normal, with NO strong evidence of appendicitis at this time. 2. No dilated or inflamed bowel loops are identified. 3. No hydronephrosis, hydroureter or ureteral calculus. Dense renal medullary pyramids but no discrete renal stone is identified.   Electronically Signed   By: Odessa FlemingH  Hall M.D.   On: 12/30/2014 07:24  I personally reviewed the imaging tests through PACS system I reviewed available ER/hospitalization records through the EMR    EKG Interpretation None      MDM   Final diagnoses:  None    Nonspecific fluid in the abdomen.  Vital signs are normal.  CBC is normal.  Discharge home in good condition.  Recommended 24 follow-up if his symptoms continue.  He understands to return to the ER for new or worsening symptoms.    Azalia BilisKevin Dima Ferrufino, MD 12/30/14 [phone]

## 2014-12-30 NOTE — ED Notes (Signed)
Pt reports right sided pain in his right rib cage after eating last night. Pt states that he has taken gas pills and alieve with no relief.

## 2015-05-31 ENCOUNTER — Encounter (HOSPITAL_COMMUNITY): Payer: Self-pay | Admitting: *Deleted

## 2015-05-31 ENCOUNTER — Emergency Department (HOSPITAL_COMMUNITY)
Admission: EM | Admit: 2015-05-31 | Discharge: 2015-05-31 | Disposition: A | Discharge status: Home / Self Care | Payer: BC Managed Care – PPO | Attending: Emergency Medicine | Admitting: Emergency Medicine

## 2015-05-31 DIAGNOSIS — R079 Chest pain, unspecified: Secondary | ICD-10-CM | POA: Insufficient documentation

## 2015-05-31 DIAGNOSIS — Z87891 Personal history of nicotine dependence: Secondary | ICD-10-CM | POA: Insufficient documentation

## 2015-05-31 DIAGNOSIS — M940 Chondrocostal junction syndrome [Tietze]: Secondary | ICD-10-CM | POA: Insufficient documentation

## 2015-05-31 DIAGNOSIS — M546 Pain in thoracic spine: Secondary | ICD-10-CM | POA: Insufficient documentation

## 2015-05-31 MED ORDER — IBUPROFEN 400 MG PO TABS
800.0000 mg | ORAL_TABLET | Freq: Once | ORAL | Status: AC
Start: 2015-05-31 — End: 2015-05-31
  Administered 2015-05-31: 800 mg via ORAL
  Filled 2015-05-31: qty 2

## 2015-05-31 MED ORDER — METHOCARBAMOL 500 MG PO TABS: 500.0000 mg | ORAL_TABLET | Freq: Two times a day (BID) | ORAL | Status: AC

## 2015-05-31 NOTE — Discharge Instructions (Signed)
You were evaluated in the ED today and there is not appear to be an emergent cause for your symptoms at this time. Please take medications as prescribed. Do not take your muscle relaxer/Robaxin before driving or operating machinery. Follow-up with your doctor next week for reevaluation. Return to ED for worsening symptoms.  Costochondritis Costochondritis, sometimes called Tietze syndrome, is a swelling and irritation (inflammation) of the tissue (cartilage) that connects your ribs with your breastbone (sternum). It causes pain in the chest and rib area. Costochondritis usually goes away on its own over time. It can take up to 6 weeks or longer to get better, especially if you are unable to limit your activities. CAUSES  Some cases of costochondritis have no known cause. Possible causes include:  Injury (trauma).  Exercise or activity such as lifting.  Severe coughing. SIGNS AND SYMPTOMS  Pain and tenderness in the chest and rib area.  Pain that gets worse when coughing or taking deep breaths.  Pain that gets worse with specific movements. DIAGNOSIS  Your health care provider will do a physical exam and ask about your symptoms. Chest X-rays or other tests may be done to rule out other problems. TREATMENT  Costochondritis usually goes away on its own over time. Your health care provider may prescribe medicine to help relieve pain. HOME CARE INSTRUCTIONS   Avoid exhausting physical activity. Try not to strain your ribs during normal activity. This would include any activities using chest, abdominal, and side muscles, especially if heavy weights are used.  Apply ice to the affected area for the first 2 days after the pain begins.  Put ice in a plastic bag.  Place a towel between your skin and the bag.  Leave the ice on for 20 minutes, 2-3 times a day.  Only take over-the-counter or prescription medicines as directed by your health care provider. SEEK MEDICAL CARE IF:  You have  redness or swelling at the rib joints. These are signs of infection.  Your pain does not go away despite rest or medicine. SEEK IMMEDIATE MEDICAL CARE IF:   Your pain increases or you are very uncomfortable.  You have shortness of breath or difficulty breathing.  You cough up blood.  You have worse chest pains, sweating, or vomiting.  You have a fever or persistent symptoms for more than 2-3 days.  You have a fever and your symptoms suddenly get worse. MAKE SURE YOU:   Understand these instructions.  Will watch your condition.  Will get help right away if you are not doing well or get worse. Document Released: 05/23/2005 Document Revised: 06/03/2013 Document Reviewed: 03/17/2013 [facility] Patient Information 2015 [place], [place]. This information is not intended to replace advice given to you by your health care provider. Make sure you discuss any questions you have with your health care provider.  Thoracic Strain You have injured the muscles or tendons that attach to the upper part of your back behind your chest. This injury is called a thoracic strain, thoracic sprain, or mid-back strain.  CAUSES  The cause of thoracic strain varies. A less severe injury involves pulling a muscle or tendon without tearing it. A more severe injury involves tearing (rupturing) a muscle or tendon. With less severe injuries, there may be little loss of strength. Sometimes, there are breaks (fractures) in the bones to which the muscles are attached. These fractures are rare, unless there was a direct hit (trauma) or you have weak bones due to osteoporosis or age. Longstanding  strains may be caused by overuse or improper form during certain movements. Obesity can also increase your risk for back injuries. Sudden strains may occur due to injury or not warming up properly before exercise. Often, there is no obvious cause for a thoracic strain. SYMPTOMS  The main symptom is pain, especially with movement, such  as during exercise. DIAGNOSIS  Your caregiver can usually tell what is wrong by taking an X-ray and doing a physical exam. TREATMENT   Physical therapy may be helpful for recovery. Your caregiver can give you exercises to do or refer you to a physical therapist after your pain improves.  After your pain improves, strengthening and conditioning programs appropriate for your sport or occupation may be helpful.  Always warm up before physical activities or athletics. Stretching after physical activity may also help.  Certain over-the-counter medicines may also help. Ask your caregiver if there are medicines that would help you. If this is your first thoracic strain injury, proper care and proper healing time before starting activities should prevent long-term problems. Torn ligaments and tendons require as long to heal as broken bones. Average healing times may be only 1 week for a mild strain. For torn muscles and tendons, healing time may be up to 6 weeks to 2 months. HOME CARE INSTRUCTIONS   Apply ice to the injured area. Ice massages may also be used as directed.  Put ice in a plastic bag.  Place a towel between your skin and the bag.  Leave the ice on for 15-20 minutes, 03-04 times a day, for the first 2 days.  Only take over-the-counter or prescription medicines for pain, discomfort, or fever as directed by your caregiver.  Keep your appointments for physical therapy if this was prescribed.  Use wraps and back braces as instructed. SEEK IMMEDIATE MEDICAL CARE IF:   You have an increase in bruising, swelling, or pain.  Your pain has not improved with medicines.  You develop new shortness of breath, chest pain, or fever.  Problems seem to be getting worse rather than better. MAKE SURE YOU:   Understand these instructions.  Will watch your condition.  Will get help right away if you are not doing well or get worse. Document Released: 11/03/2003 Document Revised: 11/05/2011  Document Reviewed: 09/29/2010 [facility] Patient Information 2015 [place], [place]. This information is not intended to replace advice given to you by your health care provider. Make sure you discuss any questions you have with your health care provider.

## 2015-05-31 NOTE — ED Provider Notes (Signed)
CSN: [id number]     Arrival date & time 05/31/15  1420 History  By signing my name below, I, Freida Busman, attest that this documentation has been prepared under the direction and in the presence of non-physician practitioner, Joycie Peek, PA-C. Electronically Signed: Freida Busman, Scribe. 05/31/2015. 3:26 PM.      Chief Complaint  Patient presents with  . Back Pain    The history is provided by the patient. No language interpreter was used.     HPI Comments:  PRATHAM CASSATT is a 29 y.o. male who presents to the Emergency Department complaining of sharp pain to his right shoulder/ right upper back for 3 days. He also reports a stabbing pain to his right ribs. At this time pt rates his pain 4/10. Pt notes his pain waxes and wanes in severity and is exacerbated with cough/sneeze and when driving. Pt has a history of similar pain that resolved after taking gas medication. He reports taking gas medication for his symptoms today without relief. Patient also reports that he is active and exercises and this discomfort is not exertional. No other aggravating or modifying factors.    History reviewed. No pertinent past medical history. Past Surgical History  Procedure Laterality Date  . Abdominal surgery    . Foot surgery     History reviewed. No pertinent family history. Social History  Substance Use Topics  . Smoking status: Former Games developer  . Smokeless tobacco: None  . Alcohol Use: No    Review of Systems  Constitutional: Negative for fever and chills.  Cardiovascular: Positive for chest pain (Ribs).  Musculoskeletal: Positive for back pain (Shoulder pain ).  All other systems reviewed and are negative.  Allergies  Review of patient's allergies indicates no known allergies.  Home Medications   Prior to Admission medications   Medication Sig Start Date End Date Taking? Authorizing Provider  HYDROcodone-acetaminophen (NORCO/VICODIN) 5-325 MG per tablet Take 1 tablet by mouth  every 6 (six) hours as needed for moderate pain. 12/30/14   Azalia Bilis, MD  ibuprofen (ADVIL,MOTRIN) 600 MG tablet Take 1 tablet (600 mg total) by mouth every 8 (eight) hours as needed. 12/30/14   Azalia Bilis, MD  methocarbamol (ROBAXIN) 500 MG tablet Take 1 tablet (500 mg total) by mouth 2 (two) times daily. 05/31/15   Joycie Peek, PA-C  naproxen sodium (ANAPROX) 220 MG tablet Take 440 mg by mouth daily as needed (pain).    Historical Provider, MD   BP 112/66 mmHg  Pulse 72  Temp(Src) 98.8 F (37.1 C) (Oral)  Resp 16  SpO2 97% Physical Exam  Constitutional: He is oriented to person, place, and time. He appears well-developed and well-nourished. No distress.  HENT:  Head: Normocephalic and atraumatic.  Eyes: Conjunctivae are normal.  Cardiovascular: Normal rate, regular rhythm and normal heart sounds.   Pulmonary/Chest: Effort normal and breath sounds normal. No respiratory distress.    Focal tenderness over intercostal spaces. No crepitus or bony tenderness. No other lesions or deformities noted. No rash  Abdominal: Soft. He exhibits no distension. There is no tenderness.  Musculoskeletal:  Focal tenderness to right paraspinal thoracic musculature along the medial border of the scapula, consistent with spasm.  Neurological: He is alert and oriented to person, place, and time.  Skin: Skin is warm and dry.  Psychiatric: He has a normal mood and affect.  Nursing note and vitals reviewed.   ED Course  Procedures   DIAGNOSTIC STUDIES:  Oxygen Saturation is 97 % on  room air which is normal by my interpretation.    COORDINATION OF CARE:  3:22 PM Discussed treatment plan with pt at bedside and pt agreed to plan.  Labs Review Labs Reviewed - No data to display  Imaging Review No results found. I have personally reviewed and evaluated these images and lab results as part of my medical decision-making.   EKG Interpretation None     Meds given in ED:  Medications   ibuprofen (ADVIL,MOTRIN) tablet 800 mg (800 mg Oral Given 05/31/15 1538)    Discharge Medication List as of 05/31/2015  3:33 PM    START taking these medications   Details  methocarbamol (ROBAXIN) 500 MG tablet Take 1 tablet (500 mg total) by mouth 2 (two) times daily., Starting 05/31/2015, Until Discontinued, Print       Filed Vitals:   05/31/15 1459  BP: 112/66  Pulse: 72  Temp: 98.8 F (37.1 C)  TempSrc: Oral  Resp: 16  SpO2: 97%    MDM  Vitals stable - WNL -afebrile Pt resting comfortably in ED. PE-physical exam as above consistent with musculoskeletal pain. Will treat thoracic spasm with muscle relaxers. 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 Also given NSAIDs to help with likely costochondritis. Patient denies any cardiopulmonary complaints. Low suspicion for emergent cardiopulmonary pathology. PERC negative. Overall, patient looks very well, nontoxic, vital signs are stable and he is appropriate for discharge.  I discussed all relevant lab findings and imaging results with pt and they verbalized understanding. Discussed f/u with PCP within 48 hrs and return precautions, pt very amenable to plan.  Final diagnoses:  Costochondritis  Right-sided thoracic back pain    I personally performed the services described in this documentation, which was scribed in my presence. The recorded information has been reviewed and is accurate.   Joycie Peek, PA-C 05/31/15 1600  Laurence Spates, MD 06/01/15 0930

## 2015-05-31 NOTE — ED Notes (Signed)
Pt works as International aid/development worker. C/o right sub-scapular and right lateral rib pain. Worse with DB, sneezing, movement. Denies SOB OR /N/V.

## 2015-05-31 NOTE — ED Notes (Signed)
Patient reports right upper back pain since Sunday night. 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 Denies any injury. ROM intact dtials to pain.

## 2016-09-29 IMAGING — DX DG CHEST 2V
2 series · 2 of 2 positions shown · non-contrast
Comparison: Two-view chest 08/25/2014

CLINICAL DATA: Cough and shortness of breath for 5 days.

EXAM:
CHEST  2 VIEW

[chest pa]
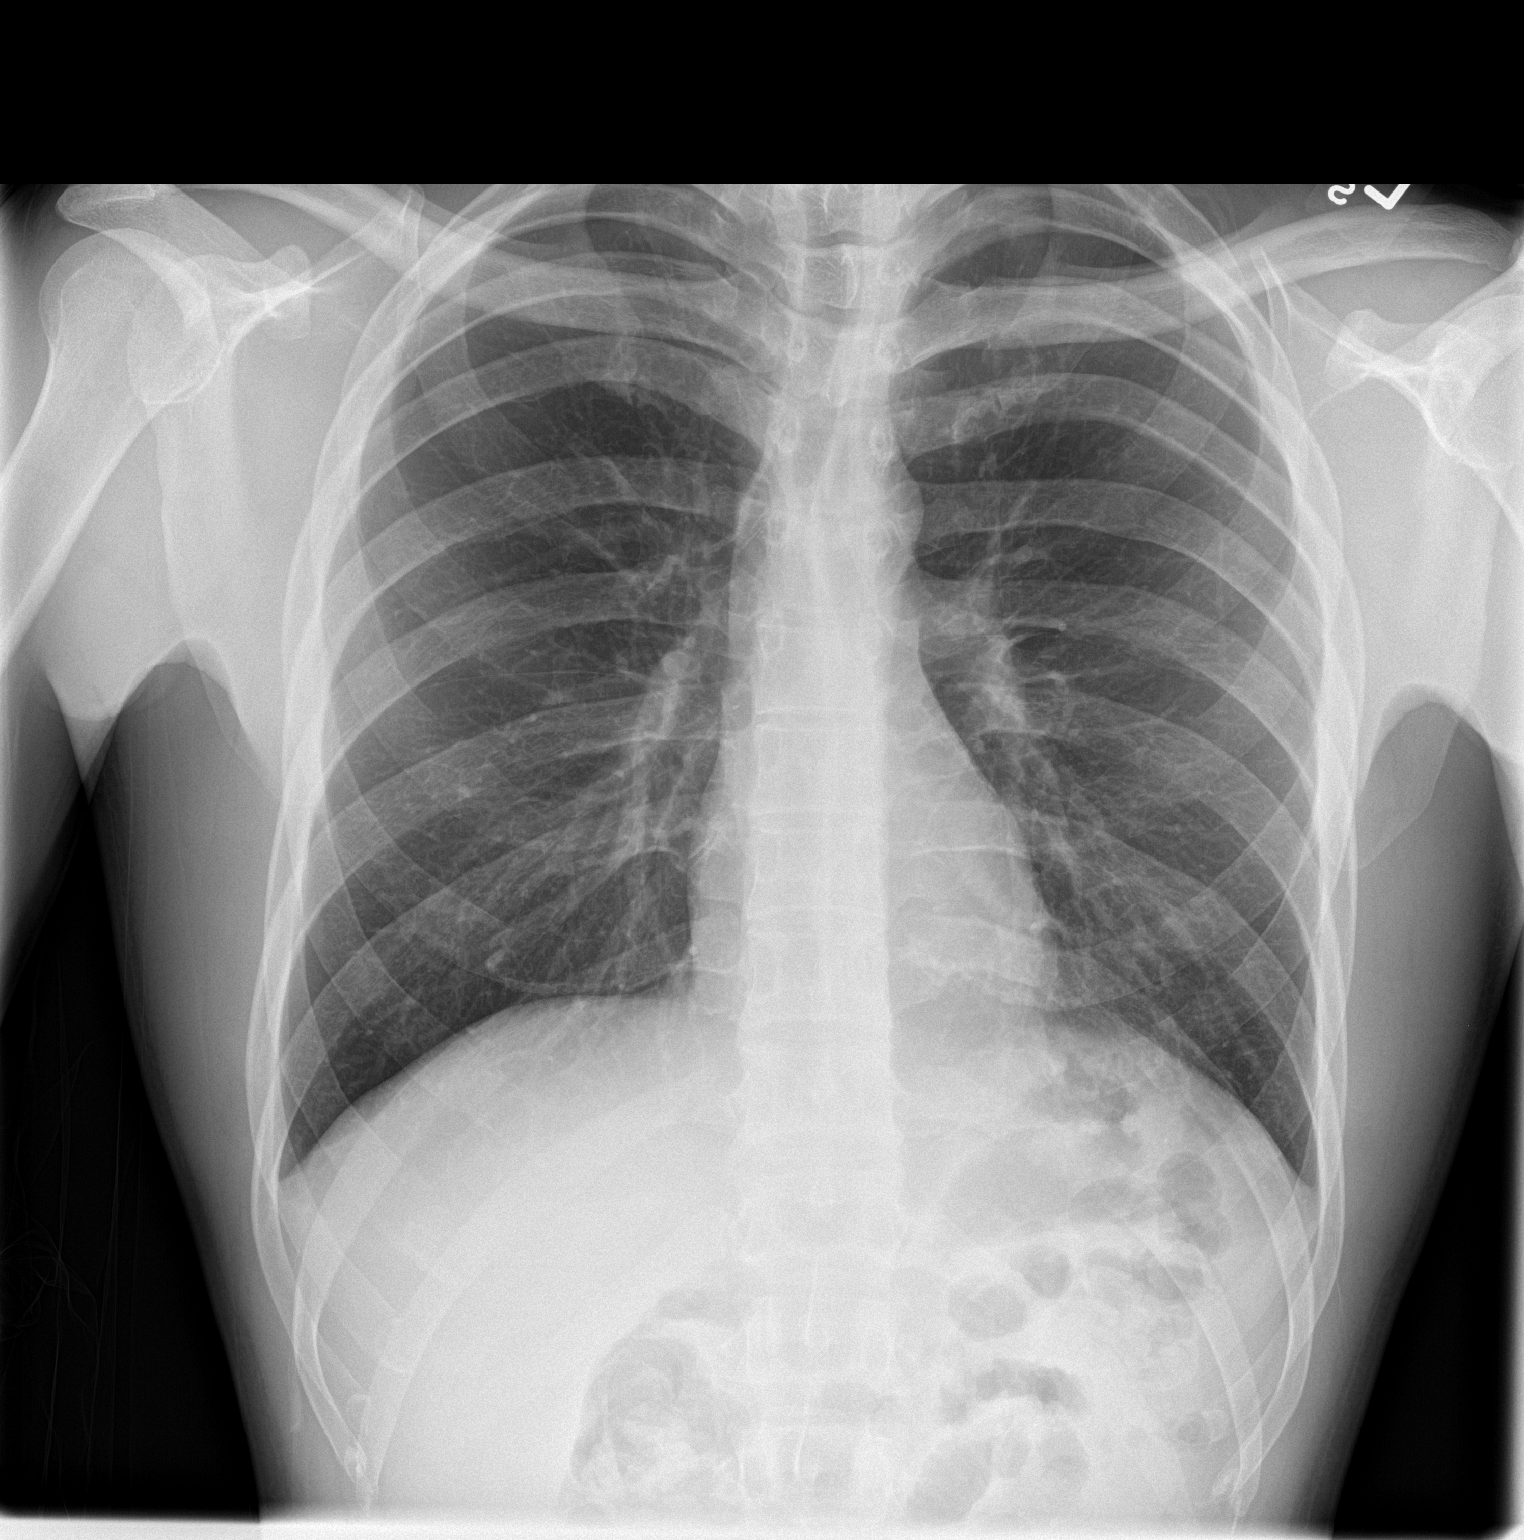

[chest lat]
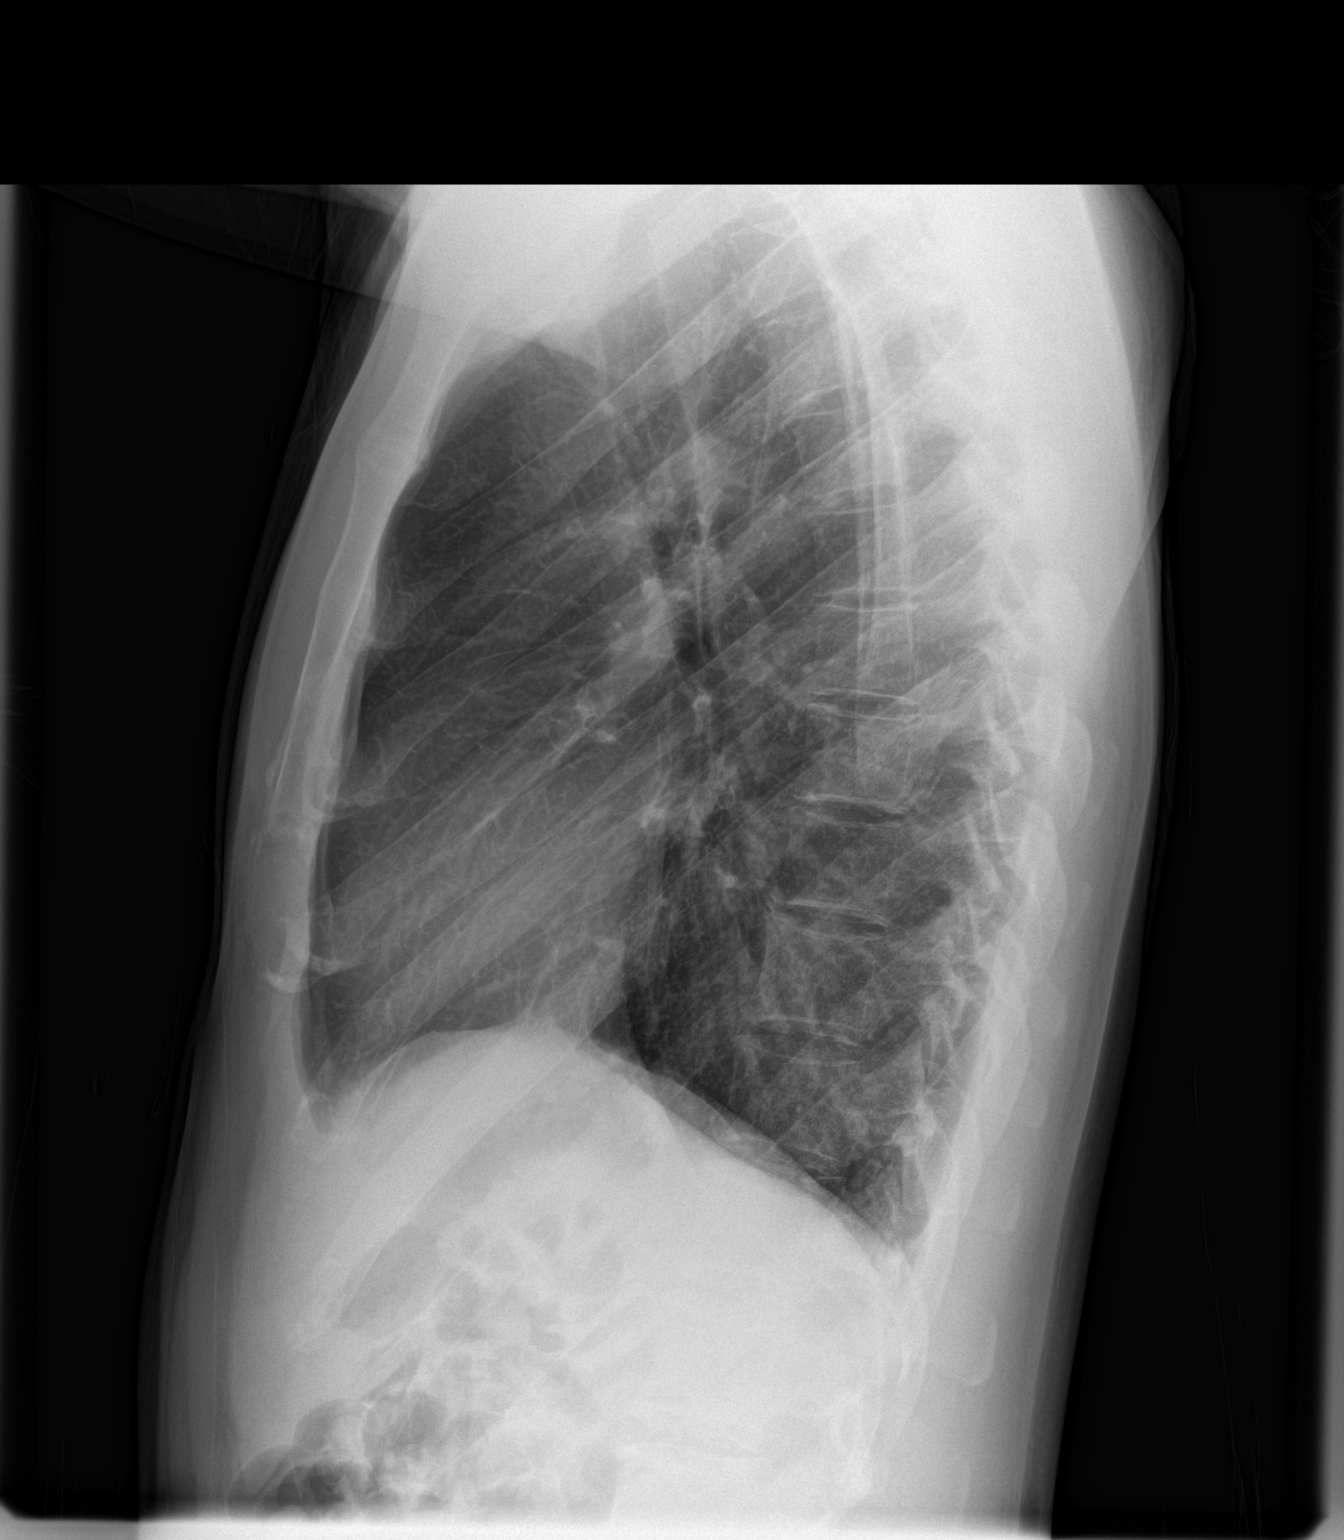

[2 of 2 positions shown; findings below may reference images not displayed]

FINDINGS: The heart size and mediastinal contours are within normal limits.
Both lungs are clear. The visualized skeletal structures are
unremarkable.
IMPRESSION: No active cardiopulmonary disease.

## 2019-08-03 ENCOUNTER — Other Ambulatory Visit: Payer: Self-pay | Admitting: Nurse Practitioner

## 2019-08-03 ENCOUNTER — Other Ambulatory Visit: Payer: Self-pay

## 2019-08-03 ENCOUNTER — Ambulatory Visit
Admission: RE | Admit: 2019-08-03 | Discharge: 2019-08-03 | Disposition: A | Discharge status: Home / Self Care | Payer: Self-pay | Source: Ambulatory Visit | Attending: Nurse Practitioner | Admitting: Nurse Practitioner

## 2019-08-03 DIAGNOSIS — R52 Pain, unspecified: Secondary | ICD-10-CM

## 2019-08-03 DIAGNOSIS — M7989 Other specified soft tissue disorders: Secondary | ICD-10-CM

## 2020-08-17 ENCOUNTER — Other Ambulatory Visit (HOSPITAL_COMMUNITY): Payer: Self-pay | Admitting: Internal Medicine

## 2020-08-17 ENCOUNTER — Ambulatory Visit: Payer: 59 | Attending: Internal Medicine

## 2020-08-17 DIAGNOSIS — Z23 Encounter for immunization: Secondary | ICD-10-CM

## 2020-08-17 NOTE — Progress Notes (Signed)
   Covid-19 Vaccination Clinic  Name:  George [name]    MRN: [id number] DOB: October 24, 1985  08/17/2020  George [name] was observed post Covid-19 immunization for 15 minutes without incident. He was provided with Vaccine Information Sheet and instruction to access the V-Safe system.   George [name] was instructed to call 911 with any severe reactions post vaccine: Marland Kitchen Difficulty breathing  . Swelling of face and throat  . A fast heartbeat  . A bad rash all over body  . Dizziness and weakness   Immunizations Administered    Name Date Dose VIS Date Route   Pfizer COVID-19 Vaccine 08/17/2020  9:03 AM 0.3 mL 06/15/2020 Intramuscular   Manufacturer: [employer], [employer]   Lot: [id number]   NDC: [address]-3

## 2020-08-23 ENCOUNTER — Ambulatory Visit: Payer: 59 | Admitting: Nurse Practitioner

## 2020-09-27 ENCOUNTER — Ambulatory Visit: Payer: 59 | Admitting: Family

## 2021-03-02 ENCOUNTER — Emergency Department (HOSPITAL_COMMUNITY)
Admission: EM | Admit: 2021-03-02 | Discharge: 2021-03-02 | Disposition: A | Discharge status: Home / Self Care | Payer: 59 | Attending: Emergency Medicine | Admitting: Emergency Medicine

## 2021-03-02 ENCOUNTER — Other Ambulatory Visit: Payer: Self-pay

## 2021-03-02 ENCOUNTER — Encounter (HOSPITAL_COMMUNITY): Payer: Self-pay | Admitting: Emergency Medicine

## 2021-03-02 DIAGNOSIS — Z5321 Procedure and treatment not carried out due to patient leaving prior to being seen by health care provider: Secondary | ICD-10-CM | POA: Insufficient documentation

## 2021-03-02 DIAGNOSIS — R6884 Jaw pain: Secondary | ICD-10-CM | POA: Diagnosis present

## 2021-03-02 NOTE — ED Notes (Signed)
Called pt 2x without answer moving pt off the floor

## 2021-03-02 NOTE — ED Triage Notes (Signed)
Pt presents to ED Pov. Pt c/o L jaw pain. Pt reports that he thinks pain is coming from tooth.

## 2021-03-02 NOTE — ED Notes (Signed)
Pt called x3 for vitals recheck, no response.  

## 2021-03-02 NOTE — ED Provider Notes (Signed)
Emergency Medicine Provider Triage Evaluation Note  George [name] , a 35 y.o. male  was evaluated in triage.  Pt complains of L sided jaw pain, onset at 2200 tonight. Pain is intermittent and sharp, present in the lower jaw. No known modifying factors. Took tylenol with little relief. Denies hx of oral/facial trauma. No associated fevers.  Review of Systems  Positive: Jaw pain Negative: Fever  Physical Exam  BP 116/75 (BP Location: Right Arm)   Pulse 72   Resp 18   SpO2 98%  Gen:   Awake, no distress   Resp:  Normal effort  MSK:   Moves extremities without difficulty  Other:  No facial swelling, gingival swelling or fluctuance, malocclusion. Soft oral floor. Tolerating secretions with normal phonation.  Medical Decision Making  Medically screening exam initiated at 3:01 AM.  Appropriate orders placed.  Paulanthony A Poet was informed that the remainder of the evaluation will be completed by another provider, this initial triage assessment does not replace that evaluation, and the importance of remaining in the ED until their evaluation is complete.  L lower jaw pain   Antony Madura, PA-C 03/02/21 0306    Glynn Octave, MD 03/02/21 [phone]

## 2021-07-17 ENCOUNTER — Other Ambulatory Visit: Payer: Self-pay | Admitting: Nurse Practitioner

## 2021-07-17 ENCOUNTER — Ambulatory Visit
Admission: RE | Admit: 2021-07-17 | Discharge: 2021-07-17 | Disposition: A | Discharge status: Home / Self Care | Payer: No Typology Code available for payment source | Source: Ambulatory Visit | Attending: Nurse Practitioner | Admitting: Nurse Practitioner

## 2021-07-17 DIAGNOSIS — R52 Pain, unspecified: Secondary | ICD-10-CM

## 2022-03-30 ENCOUNTER — Ambulatory Visit
Admission: RE | Admit: 2022-03-30 | Discharge: 2022-03-30 | Disposition: A | Discharge status: Home / Self Care | Payer: No Typology Code available for payment source | Source: Ambulatory Visit | Attending: Family Medicine | Admitting: Family Medicine

## 2022-03-30 ENCOUNTER — Other Ambulatory Visit: Payer: Self-pay | Admitting: Family Medicine

## 2022-03-30 DIAGNOSIS — S6992XA Unspecified injury of left wrist, hand and finger(s), initial encounter: Secondary | ICD-10-CM
